# Patient Record
Sex: Female | Born: 1975 | Race: White | Hispanic: No | Marital: Married | Smoking: Never smoker
Health system: Southern US, Community
[De-identification: ages and names within clinical notes are randomized; demographics above are authoritative.]

## PROBLEM LIST (undated history)

## (undated) HISTORY — PX: TONSILLECTOMY: SUR1361

---

## 2013-08-21 DIAGNOSIS — Z01419 Encounter for gynecological examination (general) (routine) without abnormal findings: Secondary | ICD-10-CM | POA: Insufficient documentation

## 2013-08-21 DIAGNOSIS — E663 Overweight: Secondary | ICD-10-CM | POA: Insufficient documentation

## 2013-08-21 DIAGNOSIS — E559 Vitamin D deficiency, unspecified: Secondary | ICD-10-CM | POA: Insufficient documentation

## 2013-08-21 DIAGNOSIS — N3941 Urge incontinence: Secondary | ICD-10-CM | POA: Insufficient documentation

## 2017-09-09 ENCOUNTER — Emergency Department
Admission: EM | Admit: 2017-09-09 | Discharge: 2017-09-09 | Disposition: A | Discharge status: Home / Self Care | Payer: BLUE CROSS/BLUE SHIELD | Attending: Emergency Medicine | Admitting: Emergency Medicine

## 2017-09-09 ENCOUNTER — Emergency Department: Payer: BLUE CROSS/BLUE SHIELD

## 2017-09-09 ENCOUNTER — Encounter: Payer: Self-pay | Admitting: Emergency Medicine

## 2017-09-09 DIAGNOSIS — Y999 Unspecified external cause status: Secondary | ICD-10-CM | POA: Insufficient documentation

## 2017-09-09 DIAGNOSIS — Y92481 Parking lot as the place of occurrence of the external cause: Secondary | ICD-10-CM | POA: Insufficient documentation

## 2017-09-09 DIAGNOSIS — S52132A Displaced fracture of neck of left radius, initial encounter for closed fracture: Secondary | ICD-10-CM | POA: Diagnosis not present

## 2017-09-09 DIAGNOSIS — Y9301 Activity, walking, marching and hiking: Secondary | ICD-10-CM | POA: Insufficient documentation

## 2017-09-09 DIAGNOSIS — W010XXA Fall on same level from slipping, tripping and stumbling without subsequent striking against object, initial encounter: Secondary | ICD-10-CM | POA: Diagnosis not present

## 2017-09-09 DIAGNOSIS — S52135A Nondisplaced fracture of neck of left radius, initial encounter for closed fracture: Secondary | ICD-10-CM

## 2017-09-09 DIAGNOSIS — S59912A Unspecified injury of left forearm, initial encounter: Secondary | ICD-10-CM | POA: Diagnosis present

## 2017-09-09 NOTE — ED Notes (Signed)
Pt says she tripped earlier today while out shopping; c/o pain to left arm, above elbow and left wrist;

## 2017-09-09 NOTE — Discharge Instructions (Addendum)
Please wear your sling as needed for symptomatic relief.  Return to the emergency department sooner for any concerns  It was a pleasure to take care of you today, and thank you for coming to our emergency department.  If you have any questions or concerns before leaving please ask the nurse to grab me and I'm more than happy to go through your aftercare instructions again.  If you were prescribed any opioid pain medication today such as Norco, Vicodin, Percocet, morphine, hydrocodone, or oxycodone please make sure you do not drive when you are taking this medication as it can alter your ability to drive safely.  If you have any concerns once you are home that you are not improving or are in fact getting worse before you can make it to your follow-up appointment, please do not hesitate to call 911 and come back for further evaluation.  Merrily BrittleNeil Nicolle Heward, MD  No results found for this or any previous visit. Dg Elbow Complete Left  Result Date: 09/09/2017 CLINICAL DATA:  Fall onto left arm EXAM: LEFT ELBOW - COMPLETE 3+ VIEW COMPARISON:  None. FINDINGS: There is mild irregularity and the anterolateral aspect of the radial neck. There is a small elbow effusion. No other osseous abnormality is seen. IMPRESSION: Possible nondisplaced fracture of the left radius neck with associated small joint effusion. Electronically Signed   By: Deatra RobinsonKevin  Herman M.D.   On: 09/09/2017 04:25   Dg Forearm Left  Result Date: 09/09/2017 CLINICAL DATA:  Fall onto left arm EXAM: LEFT FOREARM - 2 VIEW COMPARISON:  Concomitant left elbow radiograph FINDINGS: As described on the concurrently performed left elbow radiograph, there may be a nondisplaced fracture of the radial neck. Otherwise, the bones of the left forearm are normal. IMPRESSION: Possible nondisplaced fracture of the neck of the left radius is described on the concomitant elbow radiograph. Otherwise normal left forearm. Electronically Signed   By: Deatra RobinsonKevin  Herman M.D.    On: 09/09/2017 04:26

## 2017-09-09 NOTE — ED Triage Notes (Signed)
Patient fell around 1030am this morning at [employer] over a concrete curb and was going to wait to go to urgent care nit the pain has increased despite using ice and elevation.  She is asking if we can see if it is broken.  Her left elbow is swollen and it pains her during rotation and gripping.  Her pain is 7/10 during exertion.

## 2017-09-09 NOTE — ED Provider Notes (Signed)
[facility] Emergency Department Provider Note  ____________________________________________   First MD Initiated Contact with Patient 09/09/17 [phone]     (approximate)  I have reviewed the triage vital signs and the nursing notes.   HISTORY  Chief Complaint Arm Pain   HPI Natasha Cisneros is a 41 y.o. female who comes the emergency department with sudden onset severe left elbow pain that began this morning around 10 in the morning when she tripped and fell in a parking lot.  She is right-hand dominant.  She fell with outstretched hands and noted immediate severe pain in the.  She tried not to come to the hospital, however the pain became severe when moving her arm so she came to the ER tonight.  She denies numbness or weakness.  She did not strike her head.  History reviewed. No pertinent past medical history.  There are no active problems to display for this patient.   Past Surgical History:  Procedure Laterality Date  . TONSILLECTOMY      Prior to Admission medications   Not on File    Allergies Lortab [hydrocodone-acetaminophen]  No family history on file.  Social History Social History   Tobacco Use  . Smoking status: Never Smoker  . Smokeless tobacco: Never Used  Substance Use Topics  . Alcohol use: No    Frequency: Never  . Drug use: No    Review of Systems Constitutional: No fever/chills ENT: No sore throat. Cardiovascular: Denies chest pain. Respiratory: Denies shortness of breath. Gastrointestinal: No abdominal pain.  No nausea, no vomiting.  No diarrhea.  No constipation. Musculoskeletal: Negative for back pain. Neurological: Negative for headaches   ____________________________________________   PHYSICAL EXAM:  VITAL SIGNS: ED Triage Vitals  Enc Vitals Group     BP 09/09/17 0323 (!) 149/103     Pulse Rate 09/09/17 0323 78     Resp 09/09/17 0323 18     Temp 09/09/17 0323 97.7 F (36.5 C)     Temp Source 09/09/17  0323 Oral     SpO2 09/09/17 0323 100 %     Weight 09/09/17 0323 180 lb (81.6 kg)     Height 09/09/17 0323 5' (1.524 m)     Head Circumference --      Peak Flow --      Pain Score 09/09/17 0342 7     Pain Loc --      Pain Edu? --      Excl. in GC? --     Constitutional: Alert and oriented x4 joking laughing well-appearing nontoxic no diaphoresis speaks full clear sentences Head: Atraumatic. Nose: No congestion/rhinnorhea. Mouth/Throat: No trismus Neck: No stridor.   Cardiovascular: Regular rate and rhythm Respiratory: Normal respiratory effort.  No retractions. MSK: No tenderness over distal radius or distal ulna. No tenderness over snuffbox and no axial load discomfort Sensation intact to light touch over first dorsal webspace, distal index finger, distal small finger Can flex and oppose  thumb, cross 2 on 3, and extend wrist 2+ radial pulse and less than 2 second capillary refill Skin is closed Compartments are soft Quite tender over radial head on the left able to supinate her hand but not quite able to fully extend the elbow Neurologic:  Normal speech and language. No gross focal neurologic deficits are appreciated.  Skin:  Skin is warm, dry and intact. No rash noted.    ____________________________________________  LABS (all labs ordered are listed, but only abnormal results are displayed)  Labs  Reviewed - No data to display   __________________________________________  EKG   ____________________________________________  RADIOLOGY  X-ray reviewed by me concerning for nondisplaced radial neck fracture ____________________________________________   DIFFERENTIAL includes but not limited to  Radial neck fracture, radial head fracture, scaphoid fracture   PROCEDURES  Procedure(s) performed: no  Procedures  Critical Care performed: no  Observation: o ____________________________________________   INITIAL IMPRESSION / ASSESSMENT AND PLAN / ED  COURSE  Pertinent labs & imaging results that were available during my care of the patient were reviewed by me and considered in my medical decision making (see chart for details).  The patient is able to nearly fully extend her elbow and she can fully supinate.  She is neurovascularly intact.  She declines pain medication aside from ibuprofen.  She is placed in a sling and I will refer her to orthopedics as an outpatient.  She is discharged home in improved condition verbalizes understanding and agreement the plan.      ____________________________________________   FINAL CLINICAL IMPRESSION(S) / ED DIAGNOSES  Final diagnoses:  Closed nondisplaced fracture of neck of left radius, initial encounter      NEW MEDICATIONS STARTED DURING THIS VISIT:  This SmartLink is deprecated. Use AVSMEDLIST instead to display the medication list for a patient.   Note:  This document was prepared using Dragon voice recognition software and may include unintentional dictation errors.      Merrily Brittleifenbark, Patrisha Hausmann, MD 09/09/17 0730

## 2018-12-03 IMAGING — CR DG ELBOW COMPLETE 3+V*L*
1 series · 5 of 5 positions shown · non-contrast
Comparison: None.

CLINICAL DATA: Fall onto left arm

EXAM:
LEFT ELBOW - COMPLETE 3+ VIEW

[Series 1: dg elbow complete left (3+view) · 0.14mm/px · 5 of 5 slices shown]
[im 1/5]
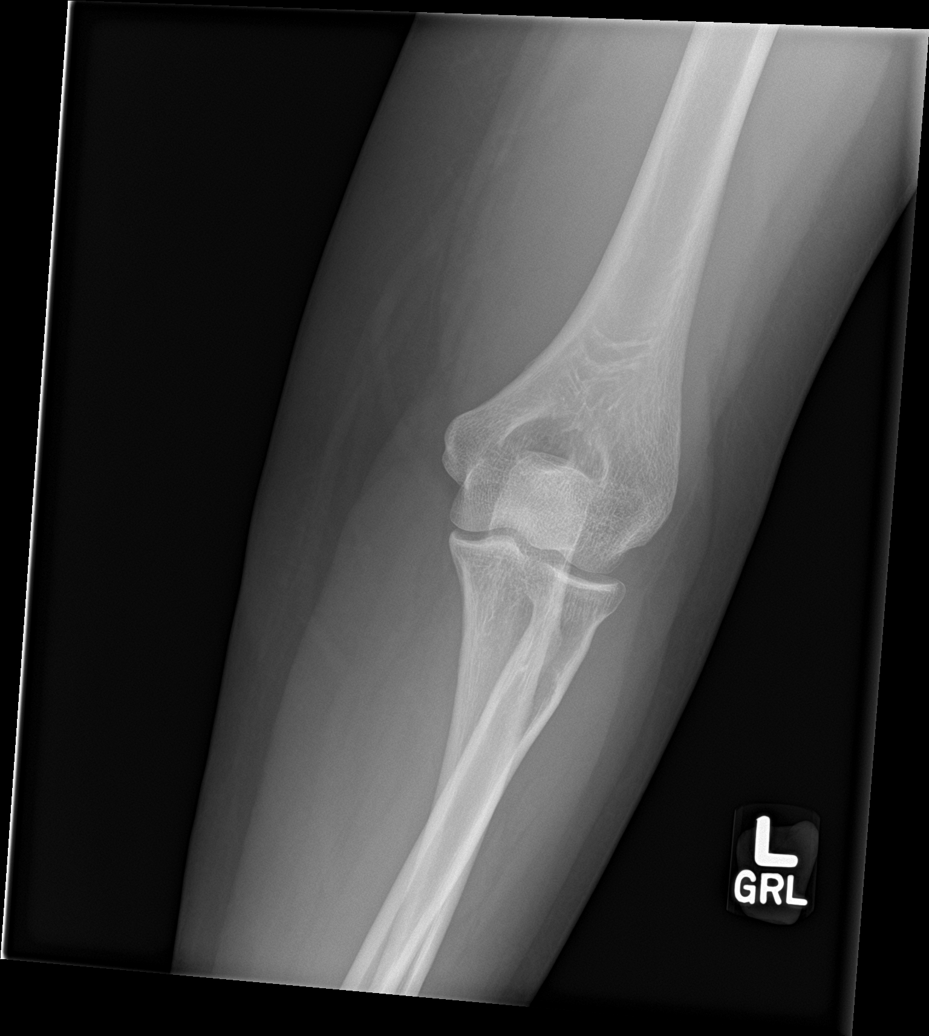
[im 2/5]
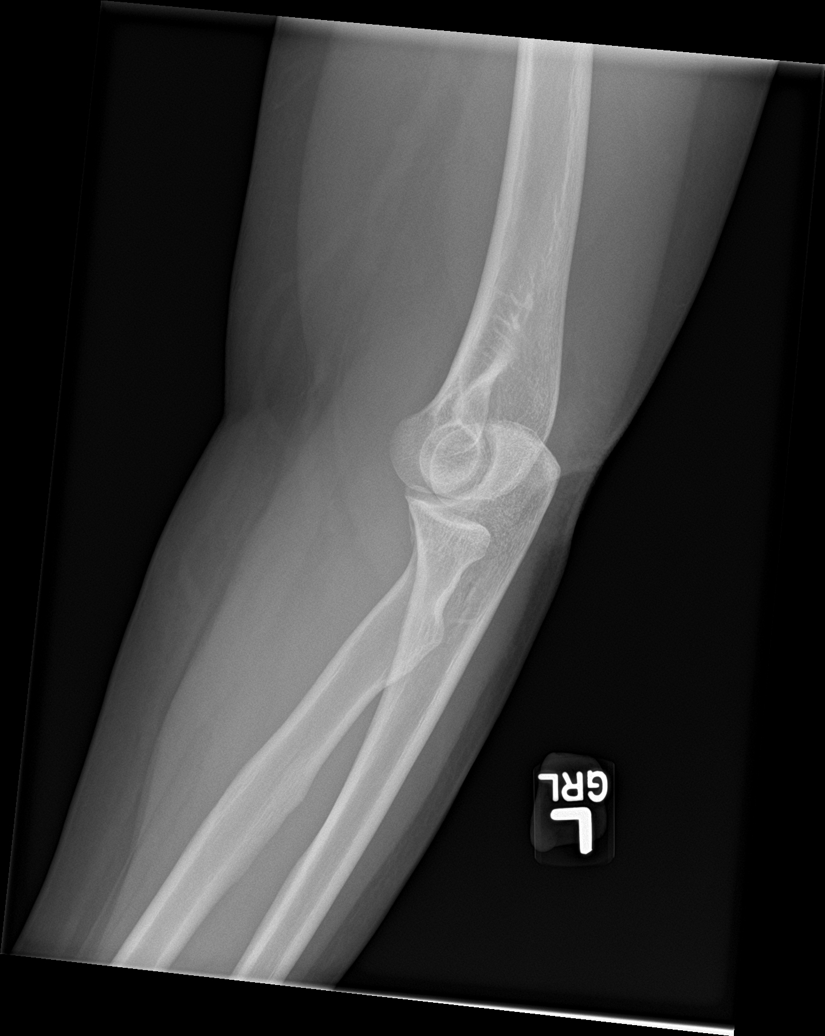
[im 3/5]
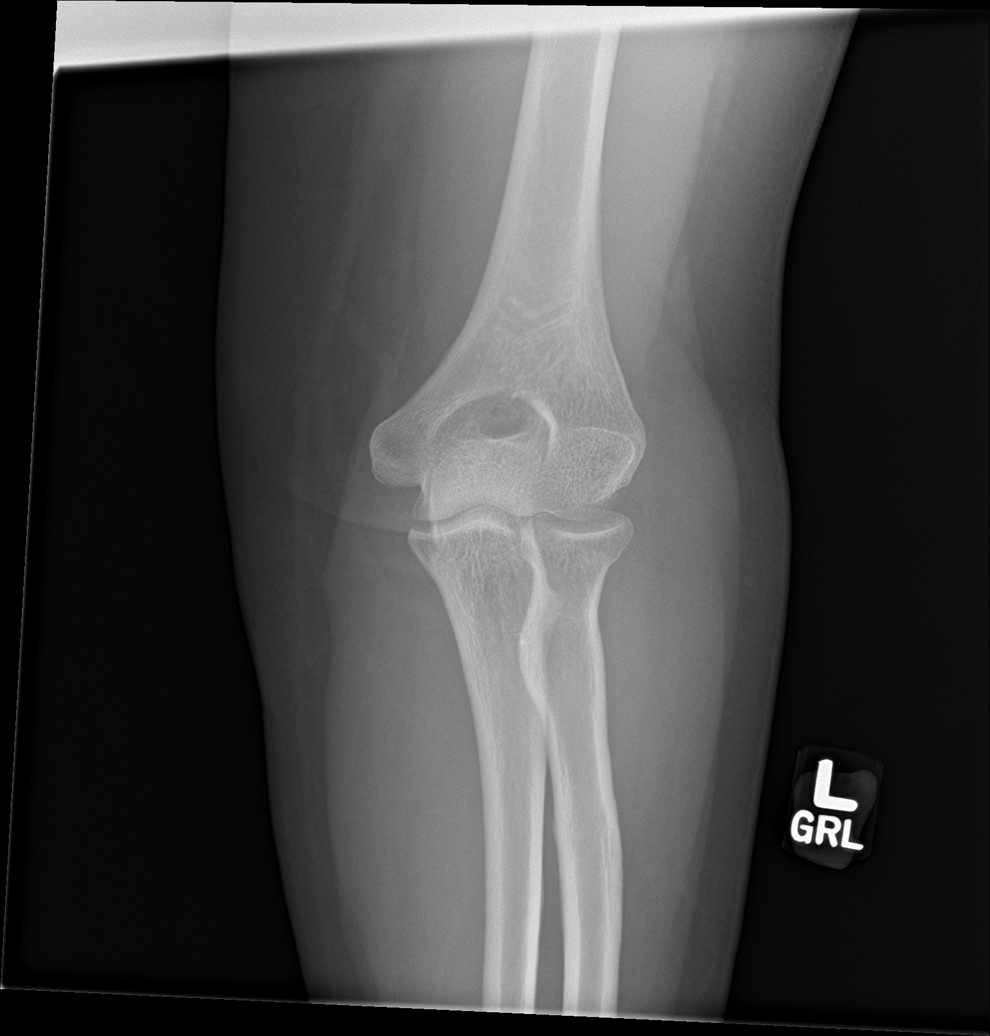
[im 4/5]
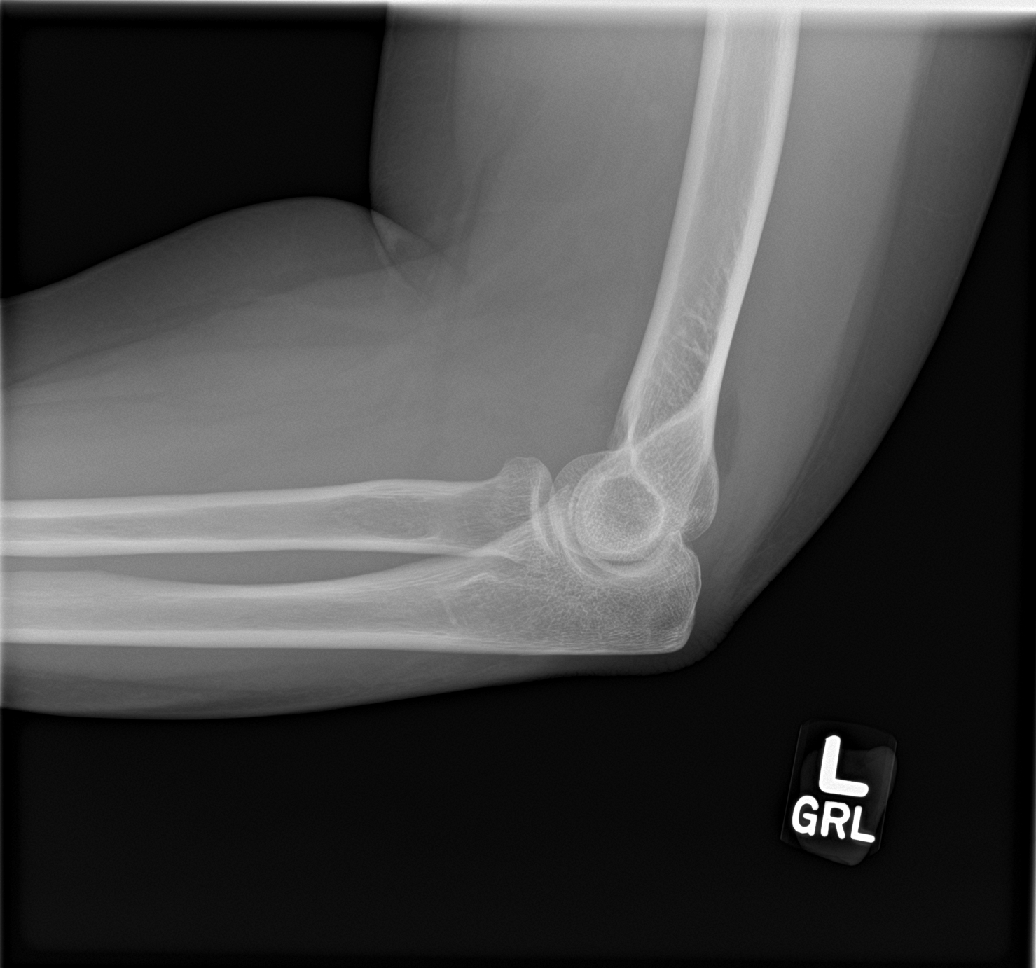
[im 5/5]
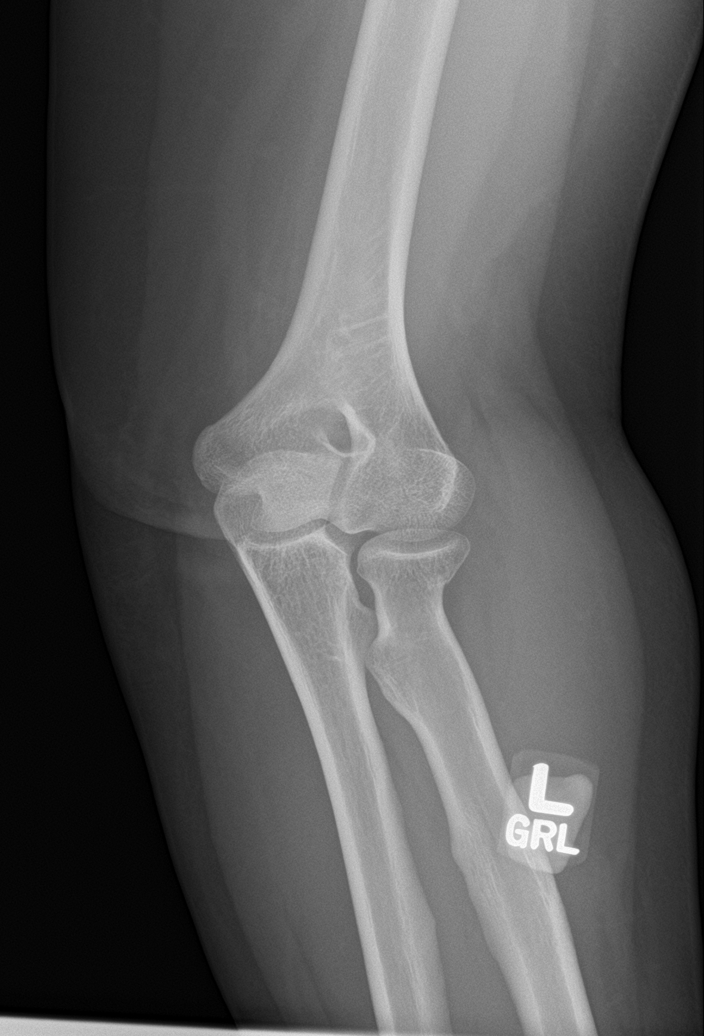

[5 of 5 positions shown; findings below may reference images not displayed]

FINDINGS: There is mild irregularity and the anterolateral aspect of the
radial neck. There is a small elbow effusion. No other osseous
abnormality is seen.
IMPRESSION: Possible nondisplaced fracture of the left radius neck with
associated small joint effusion.

## 2019-12-24 ENCOUNTER — Other Ambulatory Visit: Payer: Self-pay | Admitting: Internal Medicine

## 2019-12-24 DIAGNOSIS — I10 Essential (primary) hypertension: Secondary | ICD-10-CM | POA: Insufficient documentation

## 2019-12-24 DIAGNOSIS — Z1231 Encounter for screening mammogram for malignant neoplasm of breast: Secondary | ICD-10-CM

## 2020-03-31 DIAGNOSIS — G4733 Obstructive sleep apnea (adult) (pediatric): Secondary | ICD-10-CM | POA: Insufficient documentation

## 2020-04-28 ENCOUNTER — Ambulatory Visit
Admission: EM | Admit: 2020-04-28 | Discharge: 2020-04-28 | Disposition: A | Discharge status: Home / Self Care | Payer: BC Managed Care – PPO | Attending: Emergency Medicine | Admitting: Emergency Medicine

## 2020-04-28 DIAGNOSIS — H6691 Otitis media, unspecified, right ear: Secondary | ICD-10-CM | POA: Diagnosis not present

## 2020-04-28 MED ORDER — AZITHROMYCIN 250 MG PO TABS: 250.0000 mg | ORAL_TABLET | Freq: Every day | ORAL | 0 refills | Status: DC

## 2020-04-28 NOTE — ED Provider Notes (Signed)
Natasha Cisneros    CSN: [id number] Arrival date & time: 04/28/20  1704      History   Chief Complaint Chief Complaint  Patient presents with  . Ear Fullness    HPI Natasha Cisneros is a 44 y.o. female.  Patient presents right ear discomfort since this morning.  She states her ear feels full and has decreased hearing.  No trauma.  No drainage.  She denies fever, chills, sore throat, cough, shortness of breath, vomiting, diarrhea, rash, or other symptoms.  No treatment attempted at home.  The history is provided by the patient.    History reviewed. No pertinent past medical history.  There are no problems to display for this patient.   Past Surgical History:  Procedure Laterality Date  . TONSILLECTOMY      OB History   No obstetric history on file.      Home Medications    Prior to Admission medications   Medication Sig Start Date End Date Taking? Authorizing Provider  azithromycin (ZITHROMAX) 250 MG tablet Take 1 tablet (250 mg total) by mouth daily. Take first 2 tablets together, then 1 every day until finished. 04/28/20   Mickie Bail, NP    Family History History reviewed. No pertinent family history.  Social History Social History   Tobacco Use  . Smoking status: Never Smoker  . Smokeless tobacco: Never Used  Substance Use Topics  . Alcohol use: No  . Drug use: No     Allergies   Lortab [hydrocodone-acetaminophen]   Review of Systems Review of Systems  Constitutional: Negative for chills and fever.  HENT: Positive for ear pain. Negative for ear discharge, sore throat and trouble swallowing.   Eyes: Negative for pain and visual disturbance.  Respiratory: Negative for cough and shortness of breath.   Cardiovascular: Negative for chest pain and palpitations.  Gastrointestinal: Negative for abdominal pain, diarrhea and vomiting.  Genitourinary: Negative for dysuria and hematuria.  Musculoskeletal: Negative for arthralgias and back pain.    Skin: Negative for color change and rash.  Neurological: Negative for seizures and syncope.  All other systems reviewed and are negative.    Physical Exam Triage Vital Signs ED Triage Vitals  Enc Vitals Group     BP      Pulse      Resp      Temp      Temp src      SpO2      Weight      Height      Head Circumference      Peak Flow      Pain Score      Pain Loc      Pain Edu?      Excl. in GC?    No data found.  Updated Vital Signs BP (!) 137/95   Pulse 100   Temp 98.6 F (37 C)   Resp 16   LMP 04/14/2020 (Within Days)   SpO2 96%   Visual Acuity Right Eye Distance:   Left Eye Distance:   Bilateral Distance:    Right Eye Near:   Left Eye Near:    Bilateral Near:     Physical Exam Vitals and nursing note reviewed.  Constitutional:      General: She is not in acute distress.    Appearance: She is well-developed. She is not ill-appearing.  HENT:     Head: Normocephalic and atraumatic.     Right Ear: Ear canal and external  ear normal. Tympanic membrane is erythematous.     Left Ear: Tympanic membrane, ear canal and external ear normal.     Nose: Nose normal.     Mouth/Throat:     Mouth: Mucous membranes are moist.     Pharynx: Oropharynx is clear.  Eyes:     Conjunctiva/sclera: Conjunctivae normal.  Cardiovascular:     Rate and Rhythm: Normal rate and regular rhythm.     Heart sounds: No murmur heard.   Pulmonary:     Effort: Pulmonary effort is normal. No respiratory distress.     Breath sounds: Normal breath sounds.  Abdominal:     Palpations: Abdomen is soft.     Tenderness: There is no abdominal tenderness. There is no guarding or rebound.  Musculoskeletal:     Cervical back: Neck supple.  Skin:    General: Skin is warm and dry.     Findings: No rash.  Neurological:     General: No focal deficit present.     Mental Status: She is alert and oriented to person, place, and time.     Gait: Gait normal.  Psychiatric:        Mood and Affect:  Mood normal.        Behavior: Behavior normal.      UC Treatments / Results  Labs (all labs ordered are listed, but only abnormal results are displayed) Labs Reviewed - No data to display  EKG   Radiology No results found.  Procedures Procedures (including critical care time)  Medications Ordered in UC Medications - No data to display  Initial Impression / Assessment and Plan / UC Course  I have reviewed the triage vital signs and the nursing notes.  Pertinent labs & imaging results that were available during my care of the patient were reviewed by me and considered in my medical decision making (see chart for details).   Right otitis media.  Instructed patient to take Tylenol or ibuprofen as needed for discomfort.  Treating with Zithromax.  Instructed her to follow-up with her PCP if her symptoms are not improving.  Patient agrees to plan of care.   Final Clinical Impressions(s) / UC Diagnoses   Final diagnoses:  Right otitis media, unspecified otitis media type     Discharge Instructions     Take the antibiotic as directed.    Follow up with your primary care provider if your symptoms are not improving.       ED Prescriptions    Medication Sig Dispense Auth. Provider   azithromycin (ZITHROMAX) 250 MG tablet Take 1 tablet (250 mg total) by mouth daily. Take first 2 tablets together, then 1 every day until finished. 6 tablet Mickie Bail, NP     PDMP not reviewed this encounter.   Mickie Bail, NP 04/28/20 1731

## 2020-04-28 NOTE — ED Triage Notes (Signed)
Patient reports right sided ear fullness that started this morning. Denies pain, but states its hard to hear out of.

## 2020-04-28 NOTE — Discharge Instructions (Signed)
Take the antibiotic as directed.  Follow up with your primary care provider if your symptoms are not improving.     

## 2022-01-16 ENCOUNTER — Ambulatory Visit
Admission: RE | Admit: 2022-01-16 | Discharge: 2022-01-16 | Disposition: A | Discharge status: Home / Self Care | Payer: BC Managed Care – PPO | Source: Ambulatory Visit

## 2022-01-16 VITALS — BP 144/91 | HR 88 | Temp 98.1°F | Resp 18

## 2022-01-16 DIAGNOSIS — J0100 Acute maxillary sinusitis, unspecified: Secondary | ICD-10-CM

## 2022-01-16 MED ORDER — AMOXICILLIN 875 MG PO TABS: 875.0000 mg | ORAL_TABLET | Freq: Two times a day (BID) | ORAL | 0 refills | Status: AC

## 2022-01-16 NOTE — Discharge Instructions (Addendum)
Take the amoxicillin as directed.  Follow up with your primary care provider if your symptoms are not improving.   ° ° °

## 2022-01-16 NOTE — ED Provider Notes (Signed)
?[facility] ? ? ? ?CSN: [id number] ?Arrival date & time: 01/16/22  1437 ? ? ?  ? ?History   ?Chief Complaint ?Chief Complaint  ?Patient presents with  ? Nasal Congestion  ?  Probably a sinus infection - Entered by patient  ? sinus pressure   ? ? ?HPI ?Natasha Cisneros is a 46 y.o. female.  Patient presents with 5 day history of sinus congestion, sinus pressure, postnasal drip, runny nose, cough.  Treatment attempted with OTC allergy medications without relief.  No fever, earache, sore throat, shortness of breath, vomiting, diarrhea, or other symptoms.  No pertinent medical history. ? ?The history is provided by the patient and medical records.  ? ?History reviewed. No pertinent past medical history. ? ?There are no problems to display for this patient. ? ? ?Past Surgical History:  ?Procedure Laterality Date  ? TONSILLECTOMY    ? ? ?OB History   ?No obstetric history on file. ?  ? ? ? ?Home Medications   ? ?Prior to Admission medications   ?Medication Sig Start Date End Date Taking? Authorizing Provider  ?amLODipine (NORVASC) 5 MG tablet Take 5 mg by mouth daily. 01/04/22  Yes [provider]  ?amoxicillin (AMOXIL) 875 MG tablet Take 1 tablet (875 mg total) by mouth 2 (two) times daily for 10 days. 01/16/22 01/26/22 Yes Mickie Bail, NP  ?OZEMPIC, 0.25 OR 0.5 MG/DOSE, 2 MG/1.5ML SOPN SMARTSIG:0.375 Milliliter(s) SUB-Q Once a Week 10/28/21  Yes [provider]  ? ? ?Family History ?No family history on file. ? ?Social History ?Social History  ? ?Tobacco Use  ? Smoking status: Never  ? Smokeless tobacco: Never  ?Vaping Use  ? Vaping Use: Never used  ?Substance Use Topics  ? Alcohol use: No  ? Drug use: No  ? ? ? ?Allergies   ?Lortab [hydrocodone-acetaminophen] ? ? ?Review of Systems ?Review of Systems  ?Constitutional:  Negative for chills and fever.  ?HENT:  Positive for congestion, postnasal drip, rhinorrhea and sinus pressure. Negative for ear pain and sore throat.   ?Respiratory:  Positive for  cough. Negative for shortness of breath.   ?Cardiovascular:  Negative for chest pain and palpitations.  ?Gastrointestinal:  Negative for diarrhea and vomiting.  ?Skin:  Negative for color change and rash.  ?All other systems reviewed and are negative. ? ? ?Physical Exam ?Triage Vital Signs ?ED Triage Vitals  ?Enc Vitals Group  ?   BP   ?   Pulse   ?   Resp   ?   Temp   ?   Temp src   ?   SpO2   ?   Weight   ?   Height   ?   Head Circumference   ?   Peak Flow   ?   Pain Score   ?   Pain Loc   ?   Pain Edu?   ?   Excl. in GC?   ? ?No data found. ? ?Updated Vital Signs ?BP (!) 144/91   Pulse 88   Temp 98.1 ?F (36.7 ?C)   Resp 18   LMP 01/15/2022   SpO2 98%  ? ?Visual Acuity ?Right Eye Distance:   ?Left Eye Distance:   ?Bilateral Distance:   ? ?Right Eye Near:   ?Left Eye Near:    ?Bilateral Near:    ? ?Physical Exam ?Vitals and nursing note reviewed.  ?Constitutional:   ?   General: She is not in acute distress. ?  Appearance: Normal appearance. She is well-developed. She is not ill-appearing.  ?HENT:  ?   Right Ear: Tympanic membrane normal.  ?   Left Ear: Tympanic membrane normal.  ?   Nose: Congestion and rhinorrhea present.  ?   Mouth/Throat:  ?   Mouth: Mucous membranes are moist.  ?   Pharynx: Oropharynx is clear.  ?Cardiovascular:  ?   Rate and Rhythm: Normal rate and regular rhythm.  ?   Heart sounds: Normal heart sounds.  ?Pulmonary:  ?   Effort: Pulmonary effort is normal. No respiratory distress.  ?   Breath sounds: Normal breath sounds.  ?Musculoskeletal:  ?   Cervical back: Neck supple.  ?Skin: ?   General: Skin is warm and dry.  ?Neurological:  ?   Mental Status: She is alert.  ?Psychiatric:     ?   Mood and Affect: Mood normal.     ?   Behavior: Behavior normal.  ? ? ? ?UC Treatments / Results  ?Labs ?(all labs ordered are listed, but only abnormal results are displayed) ?Labs Reviewed - No data to display ? ?EKG ? ? ?Radiology ?No results found. ? ?Procedures ?Procedures (including critical care  time) ? ?Medications Ordered in UC ?Medications - No data to display ? ?Initial Impression / Assessment and Plan / UC Course  ?I have reviewed the triage vital signs and the nursing notes. ? ?Pertinent labs & imaging results that were available during my care of the patient were reviewed by me and considered in my medical decision making (see chart for details). ? ?  ?Acute sinusitis.  Patient has been symptomatic for 5 days and is not improving with OTC treatment.  Treating today with amoxicillin.  Discussed ibuprofen and plain Mucinex for her symptom treatment.  Instructed patient to follow up with her PCP if her symptoms are not improving.  She agrees to plan of care.  ? ? ?Final Clinical Impressions(s) / UC Diagnoses  ? ?Final diagnoses:  ?Acute non-recurrent maxillary sinusitis  ? ? ? ?Discharge Instructions   ? ?  ?Take the amoxicillin as directed.  Follow up with your primary care provider if your symptoms are not improving.   ? ? ? ? ? ?ED Prescriptions   ? ? Medication Sig Dispense Auth. Provider  ? amoxicillin (AMOXIL) 875 MG tablet Take 1 tablet (875 mg total) by mouth 2 (two) times daily for 10 days. 20 tablet Sharion Balloon, NP  ? ?  ? ?PDMP not reviewed this encounter. ?  ?Sharion Balloon, NP ?01/16/22 1503 ? ?

## 2022-01-16 NOTE — ED Triage Notes (Signed)
Pt presents with sinus pressure, nasal congestion x 5 days  ?

## 2022-07-11 ENCOUNTER — Ambulatory Visit
Admission: RE | Admit: 2022-07-11 | Discharge: 2022-07-11 | Disposition: A | Discharge status: Home / Self Care | Payer: BC Managed Care – PPO | Source: Ambulatory Visit

## 2022-07-11 VITALS — BP 113/84 | HR 97 | Temp 98.6°F | Resp 16

## 2022-07-11 DIAGNOSIS — A084 Viral intestinal infection, unspecified: Secondary | ICD-10-CM

## 2022-07-11 DIAGNOSIS — J309 Allergic rhinitis, unspecified: Secondary | ICD-10-CM | POA: Insufficient documentation

## 2022-07-11 DIAGNOSIS — N920 Excessive and frequent menstruation with regular cycle: Secondary | ICD-10-CM | POA: Insufficient documentation

## 2022-07-11 MED ORDER — ONDANSETRON HCL 4 MG/5ML PO SOLN
4.0000 mg | Freq: Once | ORAL | Status: AC
  Administered 2022-07-11: 4 mg via ORAL

## 2022-07-11 MED ORDER — ONDANSETRON 4 MG PO TBDP: 4.0000 mg | ORAL_TABLET | Freq: Three times a day (TID) | ORAL | 0 refills | Status: AC | PRN

## 2022-07-11 NOTE — ED Triage Notes (Signed)
Pt. Presents to UC w/ c/o Nausea ad diarrhea for the past 2 days. Pt. States her daughter had similar symptoms a week ago.

## 2022-07-11 NOTE — Discharge Instructions (Addendum)
Your symptoms are most likely caused by a virus, which is self limiting without treatment.  I am concerned about the risk of dehydration and recommend that you try to stay hydrated and replace electrolytes as possible.  Notify clinic if ondansetron is not controlling your symptoms of nausea.  You may start to use Imodium available over-the-counter for her symptoms of diarrhea if needed.  Follow up here or with your primary care provider if your symptoms are worsening or not improving.

## 2022-07-11 NOTE — ED Provider Notes (Signed)
[facility]    CSN: [id number] Arrival date & time: 07/11/22  1133      History   Chief Complaint Chief Complaint  Patient presents with   Diarrhea    Entered by patient   GI Problem    HPI Natasha Cisneros is a 46 y.o. female.    Diarrhea GI Problem    Presents to urgent care with complaint of nausea and diarrhea x2 days.  She states she is unable to keep anything in her stomach.  Endorses having a child who had similar symptoms a week ago, now resolved.   History reviewed. No pertinent past medical history.  Patient Active Problem List   Diagnosis Date Noted   Allergic rhinitis 07/11/2022   Heavy periods 07/11/2022   OSA (obstructive sleep apnea) 03/31/2020   Hypertension, essential 12/24/2019   Encounter for routine gynecological examination 08/21/2013   Overweight 08/21/2013   Urge incontinence 08/21/2013   Vitamin D deficiency 08/21/2013    Past Surgical History:  Procedure Laterality Date   TONSILLECTOMY      OB History   No obstetric history on file.      Home Medications    Prior to Admission medications   Medication Sig Start Date End Date Taking? Authorizing Provider  Insulin Pen Needle (PEN NEEDLES 31GX5/16") 31G X 8 MM MISC See admin instructions. 06/08/21  Yes [provider]  predniSONE (DELTASONE) 20 MG tablet Take 60mg  day 1, 50mg  day 2, 40mg  day 3, 30mg  day 4, 20mg  day 5, 10 mg day 6 11/16/14  Yes [provider]  amLODipine (NORVASC) 5 MG tablet Take 5 mg by mouth daily. 01/04/22   [provider]  OZEMPIC, 0.25 OR 0.5 MG/DOSE, 2 MG/1.5ML SOPN SMARTSIG:0.375 Milliliter(s) SUB-Q Once a Week 10/28/21   [provider]    Family History History reviewed. No pertinent family history.  Social History Social History   Tobacco Use   Smoking status: Never   Smokeless tobacco: Never  Vaping Use   Vaping Use: Never used  Substance Use Topics   Alcohol use: No   Drug use: No      Allergies   Lortab [hydrocodone-acetaminophen]   Review of Systems Review of Systems  Gastrointestinal:  Positive for diarrhea.     Physical Exam Triage Vital Signs ED Triage Vitals  Enc Vitals Group     BP 07/11/22 1221 113/84     Pulse Rate 07/11/22 1221 97     Resp 07/11/22 1221 16     Temp 07/11/22 1221 98.6 F (37 C)     Temp src --      SpO2 07/11/22 1221 98 %     Weight --      Height --      Head Circumference --      Peak Flow --      Pain Score 07/11/22 1222 5     Pain Loc --      Pain Edu? --      Excl. in [place]? --    No data found.  Updated Vital Signs BP 113/84   Pulse 97   Temp 98.6 F (37 C)   Resp 16   LMP 07/11/2022 (Approximate)   SpO2 98%   Visual Acuity Right Eye Distance:   Left Eye Distance:   Bilateral Distance:    Right Eye Near:   Left Eye Near:    Bilateral Near:     Physical Exam Vitals reviewed.  Constitutional:  Appearance: Normal appearance. She is not ill-appearing.  Skin:    General: Skin is warm and dry.  Neurological:     General: No focal deficit present.     Mental Status: She is alert and oriented to person, place, and time.  Psychiatric:        Mood and Affect: Mood normal.        Behavior: Behavior normal.      UC Treatments / Results  Labs (all labs ordered are listed, but only abnormal results are displayed) Labs Reviewed - No data to display  EKG   Radiology No results found.  Procedures Procedures (including critical care time)  Medications Ordered in UC Medications - No data to display  Initial Impression / Assessment and Plan / UC Course  I have reviewed the triage vital signs and the nursing notes.  Pertinent labs & imaging results that were available during my care of the patient were reviewed by me and considered in my medical decision making (see chart for details).   Patient is not ill appearing, in good spirits.  Likely viral process and self-limiting.  Concern for risk  of dehydration given her inability to hold down fluid.  Administered ondansetron 4 mg in office.  If effective, will discharge patient with prescription for ondansetron for home use.  Stressed importance of hydration and electrolyte replacement.  Advance diet as tolerated.  She may use OTC Imodium for control of diarrhea.   Final Clinical Impressions(s) / UC Diagnoses   Final diagnoses:  None   Discharge Instructions   None    ED Prescriptions   None    PDMP not reviewed this encounter.   Rose Phi, [place] 07/11/22 1245

## 2022-09-04 ENCOUNTER — Ambulatory Visit
Admission: EM | Admit: 2022-09-04 | Discharge: 2022-09-04 | Disposition: A | Discharge status: Home / Self Care | Payer: BC Managed Care – PPO | Attending: Urgent Care | Admitting: Urgent Care

## 2022-09-04 ENCOUNTER — Ambulatory Visit: Admit: 2022-09-04 | Discharge status: Absent without leave | Payer: BC Managed Care – PPO

## 2022-09-04 DIAGNOSIS — R6889 Other general symptoms and signs: Secondary | ICD-10-CM | POA: Diagnosis not present

## 2022-09-04 DIAGNOSIS — Z1152 Encounter for screening for COVID-19: Secondary | ICD-10-CM | POA: Diagnosis not present

## 2022-09-04 DIAGNOSIS — R490 Dysphonia: Secondary | ICD-10-CM | POA: Diagnosis not present

## 2022-09-04 DIAGNOSIS — R059 Cough, unspecified: Secondary | ICD-10-CM | POA: Diagnosis present

## 2022-09-04 NOTE — ED Triage Notes (Signed)
Pt. Presents to UC w/ c/o a cough, congestion and hoarseness for the past 3 days.

## 2022-09-04 NOTE — Discharge Instructions (Signed)
You have been diagnosed with a viral upper respiratory infection based on your symptoms and exam. Viral illnesses cannot be treated with antibiotics - they are self limiting - and you should find your symptoms resolving within a few days. Get plenty of rest and non-caffeinated fluids.  We have performed a respiratory swab checking for COVID, and influenza.  No antiviral treatment would be available to you given the duration of your symptoms.    We recommend you use over-the-counter medications for symptom control including Tylenol or ibuprofen for fever, chills or body aches, and cold/cough medication.  Saline mist spray is helpful for removing excess mucus from your nose.  Room humidifiers are helpful to ease breathing at night. You might also find relief of nasal/sinus congestion symptoms by using a nasal decongestant such as Sudafed sinus (pseudoephedrine).  You will need to obtain this medication from behind the pharmacist counter.  Speak to the pharmacist to verify that you are not duplicating medications with other over-the-counter formulations that you may be using.   Follow up here or with your primary care provider if your symptoms are worsening or not improving.

## 2022-09-04 NOTE — ED Provider Notes (Addendum)
[name]    CSN: [id number] Arrival date & time: 09/04/22  1649      History   Chief Complaint No chief complaint on file.   HPI Natasha Cisneros is a 46 y.o. female.   HPI  Presents to urgent care with complaint of cough, congestion, hoarse voice x 3 days.  She denies current sore throat though endorses some discomfort a few days ago.  She states she has some sinus pressure as well as discomfort in her ears.  No past medical history on file.  Patient Active Problem List   Diagnosis Date Noted   Allergic rhinitis 07/11/2022   Heavy periods 07/11/2022   OSA (obstructive sleep apnea) 03/31/2020   Hypertension, essential 12/24/2019   Encounter for routine gynecological examination 08/21/2013   Overweight 08/21/2013   Urge incontinence 08/21/2013   Vitamin D deficiency 08/21/2013    Past Surgical History:  Procedure Laterality Date   TONSILLECTOMY      OB History   No obstetric history on file.      Home Medications    Prior to Admission medications   Medication Sig Start Date End Date Taking? Authorizing Provider  amLODipine (NORVASC) 5 MG tablet Take 5 mg by mouth daily. 01/04/22   [provider]  Insulin Pen Needle (PEN NEEDLES 31GX5/16") 31G X 8 MM MISC See admin instructions. 06/08/21   [provider]  OZEMPIC, 0.25 OR 0.5 MG/DOSE, 2 MG/1.5ML SOPN SMARTSIG:0.375 Milliliter(s) SUB-Q Once a Week 10/28/21   [provider]  predniSONE (DELTASONE) 20 MG tablet Take 60mg  day 1, 50mg  day 2, 40mg  day 3, 30mg  day 4, 20mg  day 5, 10 mg day 6 11/16/14   [provider]    Family History No family history on file.  Social History Social History   Tobacco Use   Smoking status: Never   Smokeless tobacco: Never  Vaping Use   Vaping Use: Never used  Substance Use Topics   Alcohol use: No   Drug use: No     Allergies   Lortab [hydrocodone-acetaminophen]   Review of Systems Review of Systems   Physical  Exam Triage Vital Signs ED Triage Vitals  Enc Vitals Group     BP      Pulse      Resp      Temp      Temp src      SpO2      Weight      Height      Head Circumference      Peak Flow      Pain Score      Pain Loc      Pain Edu?      Excl. in [place]?    No data found.  Updated Vital Signs There were no vitals taken for this visit.  Visual Acuity Right Eye Distance:   Left Eye Distance:   Bilateral Distance:    Right Eye Near:   Left Eye Near:    Bilateral Near:     Physical Exam Vitals reviewed.  HENT:     Right Ear: Tympanic membrane normal.     Left Ear: Tympanic membrane normal.     Mouth/Throat:     Pharynx: No oropharyngeal exudate or posterior oropharyngeal erythema.  Skin:    General: Skin is warm and dry.  Neurological:     General: No focal deficit present.     Mental Status: She is alert and oriented to person, place, and time.  Psychiatric:        Mood and Affect: Mood normal.        Behavior: Behavior normal.      UC Treatments / Results  Labs (all labs ordered are listed, but only abnormal results are displayed) Labs Reviewed - No data to display  EKG   Radiology No results found.  Procedures Procedures (including critical care time)  Medications Ordered in UC Medications - No data to display  Initial Impression / Assessment and Plan / UC Course  I have reviewed the triage vital signs and the nursing notes.  Pertinent labs & imaging results that were available during my care of the patient were reviewed by me and considered in my medical decision making (see chart for details).   Patient is afebrile here without recent antipyretics. Satting well on room air. Overall is well appearing, well hydrated, without respiratory distress.  TMs are WNL bilaterally.  Throat is not erythematous nor is there were presence of peritonsillar exudates.  Patient is most concerned with her hoarse voice.  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  I recommended of OTC medication for symptom control including continued use of Flonase as well as Sudafed sinus for relief of nasal and sinus congestion.  I offered a course of prednisone if she thought her sinus symptoms were severe however she states again that her biggest concern is her voice.  I told her that there was no guarantee that prednisone was going to help with her voice and really it just needed time to resolve.  The patient also expressed concern about spreading illness to her father who she planned to visit in a few days.  I told her that most respiratory viruses were active and could be spread for about 5 days.  Since her symptoms started last Friday, while it is likely that the virus would no longer be active a week later, I certainly could not guarantee that and an antibiotic would not help to prevent transmission.  Patient then stated "is it customary when you go to the doctor that you do not look in your throat or nose".  I replied to the patient that it was not customary for me to look in her nose unless there was a specific concern I had such as a nosebleed.  In addition I did not look in her throat because she said she did not have a sore throat but then offered to do so.  The pharynx is nonerythematous and there are no peritonsillar exudates.  The patient seemed dissatisfied with the results of her visit today.  She left without waiting for her after visit summary.  Final Clinical Impressions(s) / UC Diagnoses   Final diagnoses:  None   Discharge Instructions   None    ED Prescriptions   None    PDMP not reviewed this encounter.   Charma Igo, FNP 09/04/22 1841    Charma Igo, FNP 09/04/22 1844

## 2022-09-05 LAB — RESP PANEL BY RT-PCR (FLU A&B, COVID) ARPGX2
Influenza A by PCR: NEGATIVE
Influenza B by PCR: NEGATIVE
SARS Coronavirus 2 by RT PCR: NEGATIVE

## 2024-03-10 ENCOUNTER — Telehealth: Payer: Self-pay | Admitting: Licensed Clinical Social Worker

## 2024-03-10 NOTE — Telephone Encounter (Signed)
 Pt called and needs the appt code for insurance purposes. She stated she needs this information before lunch today or she will not be at the appt.

## 2024-03-11 ENCOUNTER — Inpatient Hospital Stay: Attending: Oncology | Admitting: Licensed Clinical Social Worker

## 2024-03-11 ENCOUNTER — Encounter: Payer: Self-pay | Admitting: Licensed Clinical Social Worker

## 2024-03-11 ENCOUNTER — Other Ambulatory Visit: Payer: Self-pay | Admitting: Licensed Clinical Social Worker

## 2024-03-11 ENCOUNTER — Inpatient Hospital Stay

## 2024-03-11 DIAGNOSIS — Z800 Family history of malignant neoplasm of digestive organs: Secondary | ICD-10-CM | POA: Insufficient documentation

## 2024-03-11 DIAGNOSIS — Z1509 Genetic susceptibility to other malignant neoplasm: Secondary | ICD-10-CM | POA: Insufficient documentation

## 2024-03-11 DIAGNOSIS — Z803 Family history of malignant neoplasm of breast: Secondary | ICD-10-CM

## 2024-03-11 DIAGNOSIS — Z808 Family history of malignant neoplasm of other organs or systems: Secondary | ICD-10-CM

## 2024-03-11 DIAGNOSIS — Z8049 Family history of malignant neoplasm of other genital organs: Secondary | ICD-10-CM

## 2024-03-11 NOTE — Progress Notes (Signed)
 REFERRING PROVIDER: Sherial Bail, MD [address] [place],  [place] [address]  PRIMARY PROVIDER:  [facility]  PRIMARY REASON FOR VISIT:  1. Family history of colon cancer      HISTORY OF PRESENT ILLNESS:   Natasha Cisneros, a 48 y.o. female, was seen for a [facility] cancer genetics consultation at the request of Dr. Sherial due to a family history of colon cancer.  Natasha Cisneros presents to clinic today to discuss the possibility of a hereditary predisposition to cancer, genetic testing, and to further clarify her future cancer risks, as well as potential cancer risks for family members.   CANCER HISTORY:  Natasha Cisneros is a 48 y.o. female with no personal history of cancer.    RISK FACTORS:  Menarche was at age 55.  First live birth at age 63.  Ovaries intact: yes.  Hysterectomy: no.  Menopausal status: premenopausal.  HRT use: 0 years. Colonoscopy: yes; normal. Mammogram within the last year: yes. Number of breast biopsies: 0. Up to date with pelvic exams: yes.  Past Surgical History:  Procedure Laterality Date   TONSILLECTOMY      FAMILY HISTORY:  We obtained a detailed, 4-generation family history.  Significant diagnoses are listed below: Family History  Problem Relation Age of Onset   Squamous cell carcinoma Mother    Melanoma Father        x3   Colon cancer Paternal Uncle    Cancer Maternal Grandmother        eye   Breast cancer Other    Pancreatic cancer Other    Endometrial cancer Cousin 73   [name] has 2 daughters, 84 and 21. She has 2 brothers, 40 and 76, no cancers.'  Natasha Cisneros's mother is living at 63 and has history of squamous cell skin cancer and a rare tumor on her arm earlier in her life, unsure name of tumor. Patient's maternal grandmother had eye cancer. Maternal grandmother had 3 sisters with cancer- one with eye cancer, two with breast. Maternal grandfather's sister died of pancreatic cancer.  Ms.  Cisneros father was adopted but has some information about his relatives. Patient's paternal uncle died of colon cancer, unknown age, and his daughter has uterine cancer at 39. Another cousin had cancer, unknown age.   Natasha Cisneros is unaware of previous family history of genetic testing for hereditary cancer risks. There is no reported Ashkenazi Jewish ancestry. There is no known consanguinity.    GENETIC COUNSELING ASSESSMENT: Natasha Cisneros is a 48 y.o. female with a family history of cancer which is somewhat suggestive of a hereditary cancer syndrome and predisposition to cancer. We, therefore, discussed and recommended the following at today's visit.   DISCUSSION: We discussed that approximately 10% of cancer is hereditary. Most cases of hereditary colorectal/uterine cancer are associated with Lynch syndrome genes. There are other genes associated with hereditary cancer as well including CDKN2A and MITF which can increase the risk for melanoma. BRCA1 and BRCA2 increase the risk for breast, pancreatic and other cancers. Cancers and risks are gene specific. We discussed that testing is beneficial for several reasons including knowing about cancer risks, identifying potential screening and risk-reduction options that may be appropriate, and to understand if other family members could be at risk for cancer and allow them to undergo genetic testing.   We reviewed the characteristics, features and inheritance patterns of hereditary cancer syndromes. We also discussed genetic testing, including the appropriate family members to test, the process of  testing, insurance coverage and turn-around-time for results. We discussed the implications of a negative, positive and/or variant of uncertain significant result. We recommended [name] pursue genetic testing for the [facility] CancerNext-Expanded+RNA gene panel.   The CancerNext-Expanded gene panel offered by [facility] and includes sequencing,  rearrangement, and RNA analysis for the following 77 genes: AIP, ALK, APC, ATM, AXIN2, BAP1, BARD1, BMPR1A, BRCA1, BRCA2, BRIP1, CDC73, CDH1, CDK4, CDKN1B, CDKN2A, CEBPA, CHEK2, CTNNA1, DDX41, DICER1, ETV6, FH, FLCN, GATA2, LZTR1, MAX, MBD4, MEN1, MET, MLH1, MSH2, MSH3, MSH6, MUTYH, NF1, NF2, NTHL1, PALB2, PHOX2B, PMS2, POT1, PRKAR1A, PTCH1, PTEN, RAD51C, RAD51D, RB1, RET, RPS20, RUNX1, SDHA, SDHAF2, SDHB, SDHC, SDHD, SMAD4, SMARCA4, SMARCB1, SMARCE1, STK11, SUFU, TMEM127, TP53, TSC1, TSC2, VHL, and WT1 (sequencing and deletion/duplication); EGFR, HOXB13, KIT, MITF, PDGFRA, POLD1, and POLE (sequencing only); EPCAM and GREM1 (deletion/duplication only).   Based on Natasha Cisneros's family history of cancer, she meets medical criteria for genetic testing. Though Natasha Cisneros is not personally affected, there are no affected family members that are willing/able to undergo hereditary cancer testing.  Therefore, Ms. Fergusonis the most informative family member available. Despite that she meets criteria, she may still have an out of pocket cost.   PLAN: After considering the risks, benefits, and limitations, Ms. Jolly provided informed consent to pursue genetic testing and the blood sample was sent to [facility] for analysis of the CancerNext-Expanded+RNA panel. Results should be available within approximately 2-3 weeks' time, at which point they will be disclosed by telephone to Ms. Prentiss, as will any additional recommendations warranted by these results. Natasha Cisneros will receive a summary of her genetic counseling visit and a copy of her results once available. This information will also be available in Epic.   Natasha Cisneros questions were answered to her satisfaction today. Our contact information was provided should additional questions or concerns arise. Thank you for the referral and allowing us  to share in the care of your patient.   Dena Cary, MS, [facility] Genetic  Counselor [email or web address] Phone: [phone]  45 minutes were spent on the date of the encounter in service to the patient including preparation, face-to-face consultation, documentation and care coordination. Dr. Delinda was available for discussion regarding this case.   _______________________________________________________________________ For Office Staff:  Number of people involved in session: 1 Was an Intern/ student involved with case: no

## 2024-03-12 LAB — GENETIC SCREENING ORDER

## 2024-03-25 ENCOUNTER — Encounter: Payer: Self-pay | Admitting: Licensed Clinical Social Worker

## 2024-03-25 ENCOUNTER — Telehealth: Payer: Self-pay | Admitting: Licensed Clinical Social Worker

## 2024-03-25 ENCOUNTER — Ambulatory Visit: Payer: Self-pay | Admitting: Licensed Clinical Social Worker

## 2024-03-25 DIAGNOSIS — Z1379 Encounter for other screening for genetic and chromosomal anomalies: Secondary | ICD-10-CM | POA: Insufficient documentation

## 2024-03-25 NOTE — Telephone Encounter (Signed)
 I contacted Ms. Delio to discuss her genetic testing results. No pathogenic variants were identified in the 77 genes analyzed. Detailed clinic note to follow.   The test report has been scanned into EPIC and is located under the Molecular Pathology section of the Results Review tab.  A portion of the result report is included below for reference.      Dena Cary, MS, [facility] Genetic Counselor [email or web address] Phone: [phone]

## 2024-03-25 NOTE — Progress Notes (Signed)
 HPI:   Natasha Cisneros was previously seen in the  Cancer Genetics clinic due to a family history of cancer and concerns regarding a hereditary predisposition to cancer. Please refer to our prior cancer genetics clinic note for more information regarding our discussion, assessment and recommendations, at the time. Natasha Cisneros recent genetic test results were disclosed to Natasha Cisneros, as were recommendations warranted by these results. These results and recommendations are discussed in more detail below.  CANCER HISTORY:  Oncology History   No history exists.    FAMILY HISTORY:  We obtained a detailed, 4-generation family history.  Significant diagnoses are listed below: Family History  Problem Relation Age of Onset   Squamous cell carcinoma Mother    Melanoma Father        x3   Colon cancer Paternal Cisneros    Cancer Maternal Cisneros        eye   Breast cancer Other    Pancreatic cancer Other    Endometrial cancer Cousin 58    Natasha Cisneros has 2 daughters, 71 and 58. She has 2 brothers, 40 and 36, no cancers.'   Natasha Cisneros's mother is living at 66 and has history of squamous cell skin cancer and a rare tumor on Natasha Cisneros arm earlier in Natasha Cisneros life, unsure name of tumor. Natasha Cisneros had eye cancer. Maternal Cisneros had 3 sisters with cancer- one with eye cancer, two with breast. Maternal grandfather's sister died of pancreatic cancer.   Natasha Cisneros father was adopted but has some information about his relatives. Natasha Cisneros died of colon cancer, unknown age, and his daughter has uterine cancer at 45. Another cousin had cancer, unknown age.    Natasha Cisneros is unaware of previous family history of genetic testing for hereditary cancer risks. There is no reported Ashkenazi Jewish ancestry. There is no known consanguinity.     GENETIC TEST RESULTS:  The [facility] CancerNext-Expanded+RNA Panel found no pathogenic mutations.   The CancerNext-Expanded gene  panel offered by [facility] and includes sequencing, rearrangement, and RNA analysis for the following 77 genes: AIP, ALK, APC, ATM, AXIN2, BAP1, BARD1, BMPR1A, BRCA1, BRCA2, BRIP1, CDC73, CDH1, CDK4, CDKN1B, CDKN2A, CEBPA, CHEK2, CTNNA1, DDX41, DICER1, ETV6, FH, FLCN, GATA2, LZTR1, MAX, MBD4, MEN1, MET, MLH1, MSH2, MSH3, MSH6, MUTYH, NF1, NF2, NTHL1, PALB2, PHOX2B, PMS2, POT1, PRKAR1A, PTCH1, PTEN, RAD51C, RAD51D, RB1, RET, RPS20, RUNX1, SDHA, SDHAF2, SDHB, SDHC, SDHD, SMAD4, SMARCA4, SMARCB1, SMARCE1, STK11, SUFU, TMEM127, TP53, TSC1, TSC2, VHL, and WT1 (sequencing and deletion/duplication); EGFR, HOXB13, KIT, MITF, PDGFRA, POLD1, and POLE (sequencing only); EPCAM and GREM1 (deletion/duplication only).   The test report has been scanned into EPIC and is located under the Molecular Pathology section of the Results Review tab.  A portion of the result report is included below for reference. Genetic testing reported out on 03/23/2024.       Even though a pathogenic variant was not identified, possible explanations for the cancer in the family may include: There may be no hereditary risk for cancer in the family. The cancers in Natasha Cisneros and/or Natasha Cisneros family may be sporadic/familial or due to other genetic and environmental factors. There may be a gene mutation in one of these genes that current testing methods cannot detect but that chance is small. There could be another gene that has not yet been discovered, or that we have not yet tested, that is responsible for the cancer diagnoses in the family.  It is also possible there is a hereditary cause  for the cancer in the family that Natasha Cisneros did not inherit Therefore, it is important to remain in touch with cancer genetics in the future so that we can continue to offer Natasha Cisneros the most up to date genetic testing.   ADDITIONAL GENETIC TESTING:  We discussed with Natasha Cisneros that Natasha Cisneros genetic testing was fairly extensive.  If there are  additional relevant genes identified to increase cancer risk that can be analyzed in the future, we would be happy to discuss and coordinate this testing at that time.    CANCER SCREENING RECOMMENDATIONS:  Natasha Cisneros test result is considered negative (normal).  This means that we have not identified a hereditary cause for Natasha Cisneros family history of cancer at this time.   An individual's cancer risk and medical management are not determined by genetic test results alone. Overall cancer risk assessment incorporates additional factors, including personal medical history, family history, and any available genetic information that may result in a personalized plan for cancer prevention and surveillance. Therefore, it is recommended she continue to follow the cancer management and screening guidelines provided by Natasha Cisneros  primary healthcare provider.  RECOMMENDATIONS FOR FAMILY MEMBERS:   Since she did not inherit a identifiable mutation in a cancer predisposition gene included on this panel, Natasha Cisneros children could not have inherited a known mutation from Natasha Cisneros in one of these genes. Individuals in this family might be at some increased risk of developing cancer, over the general population risk, due to the family history of cancer.  Individuals in the family should notify their providers of the family history of cancer. We recommend women in this family have a yearly mammogram beginning at age 27, or 10 years younger than the earliest onset of cancer, an annual clinical breast exam, and perform monthly breast self-exams.  Family members should have colonoscopies by at age 4, or earlier, as recommended by their providers. Other members of the family may still carry a pathogenic variant in one of these genes that Natasha Cisneros did not inherit. Based on the family history, we recommend Natasha Cisneros maternal great aunts who have had cancer/those closely related, and Natasha Cisneros father (since he has had 3 melanomas) ,have genetic counseling  and testing. Natasha Cisneros will let us  know if we can be of any assistance in coordinating genetic counseling and/or testing for this family member.    FOLLOW-UP:  Lastly, we discussed with Natasha Cisneros that cancer genetics is a rapidly advancing field and it is possible that new genetic tests will be appropriate for Natasha Cisneros and/or Natasha Cisneros family members in the future. We encouraged Natasha Cisneros to remain in contact with cancer genetics on an annual basis so we can update Natasha Cisneros personal and family histories and let Natasha Cisneros know of advances in cancer genetics that may benefit this family.   Our contact number was provided. Natasha Cisneros questions were answered to Natasha Cisneros satisfaction, and she knows she is welcome to call us  at anytime with additional questions or concerns.    Dena Cary, MS, [facility] Genetic Counselor [email or web address] Phone: [phone]
